# Patient Record
Sex: Male | Born: 1975 | Race: Black or African American | Hispanic: No | Marital: Married | Smoking: Never smoker
Health system: Southern US, Community
[De-identification: ages and names within clinical notes are randomized; demographics above are authoritative.]

---

## 2021-11-03 ENCOUNTER — Other Ambulatory Visit: Payer: Self-pay

## 2021-11-03 ENCOUNTER — Encounter: Payer: Self-pay | Admitting: *Deleted

## 2021-11-03 ENCOUNTER — Emergency Department
Admission: EM | Admit: 2021-11-03 | Discharge: 2021-11-03 | Disposition: A | Discharge status: Home / Self Care | Payer: BC Managed Care – PPO | Attending: Emergency Medicine | Admitting: Emergency Medicine

## 2021-11-03 ENCOUNTER — Emergency Department: Payer: BC Managed Care – PPO

## 2021-11-03 DIAGNOSIS — Z20822 Contact with and (suspected) exposure to covid-19: Secondary | ICD-10-CM | POA: Insufficient documentation

## 2021-11-03 DIAGNOSIS — J069 Acute upper respiratory infection, unspecified: Secondary | ICD-10-CM | POA: Diagnosis not present

## 2021-11-03 DIAGNOSIS — R059 Cough, unspecified: Secondary | ICD-10-CM | POA: Diagnosis present

## 2021-11-03 LAB — RESP PANEL BY RT-PCR (FLU A&B, COVID) ARPGX2
Influenza A by PCR: NEGATIVE
Influenza B by PCR: NEGATIVE
SARS Coronavirus 2 by RT PCR: NEGATIVE

## 2021-11-03 NOTE — Discharge Instructions (Signed)
You can take one spray of Flonase each side for seven days.

## 2021-11-03 NOTE — ED Triage Notes (Signed)
Pt to ED reporting cough and congestion with generalized aching for the past couple days. Abd pain on Saturday after working in some dust but that has subsided.

## 2021-11-03 NOTE — ED Notes (Signed)
See triage note. Pt states that after dust exposure, sinus congestion, cough, fever of 101 on Sunday.

## 2021-11-03 NOTE — ED Provider Notes (Signed)
[facility]-EMERGENCY DEPARTMENT  ____________________________________________  Time seen: Approximately 11:51 PM  I have reviewed the triage vital signs and the nursing notes.   HISTORY  Chief Complaint Nasal Congestion   Historian Patient    HPI Guy Smith is a 45 y.o. male presents to the emergency department with fever, nasal congestion, nonproductive cough and malaise for the past 2 to 3 days.  No chest pain, chest tightness or abdominal pain.  Patient has had fever for the past 2 days.   History reviewed. No pertinent past medical history.   Immunizations up to date:  Yes.     History reviewed. No pertinent past medical history.  There are no problems to display for this patient.   History reviewed. No pertinent surgical history.  Prior to Admission medications   Not on File    Allergies Patient has no known allergies.  History reviewed. No pertinent family history.  Social History Social History   Tobacco Use   Smoking status: Never   Smokeless tobacco: Never      Review of Systems  Constitutional: Patient has fever.  Eyes: No visual changes. No discharge ENT: Patient has congestion.  Cardiovascular: no chest pain. Respiratory: Patient has cough.  Gastrointestinal: No abdominal pain.  No nausea, no vomiting. Patient had diarrhea.  Genitourinary: Negative for dysuria. No hematuria Musculoskeletal: Patient has myalgias.  Skin: Negative for rash, abrasions, lacerations, ecchymosis. Neurological: Patient has headache, no focal weakness or numbness.    ____________________________________________   PHYSICAL EXAM:  VITAL SIGNS: ED Triage Vitals [11/03/21 2118]  Enc Vitals Group     BP (!) 160/95     Pulse Rate 74     Resp 16     Temp 98.3 F (36.8 C)     Temp Source Oral     SpO2 98 %     Weight      Height      Head Circumference      Peak Flow      Pain Score 0     Pain Loc      Pain Edu?      Excl. in [place]?       Constitutional: Alert and oriented. Patient is lying supine. Eyes: Conjunctivae are normal. PERRL. EOMI. Head: Atraumatic. ENT:      Ears: Tympanic membranes are mildly injected with mild effusion bilaterally.       Nose: No congestion/rhinnorhea.      Mouth/Throat: Mucous membranes are moist. Posterior pharynx is mildly erythematous.  Hematological/Lymphatic/Immunilogical: No cervical lymphadenopathy.  Cardiovascular: Normal rate, regular rhythm. Normal S1 and S2.  Good peripheral circulation. Respiratory: Normal respiratory effort without tachypnea or retractions. Lungs CTAB. Good air entry to the bases with no decreased or absent breath sounds. Gastrointestinal: Bowel sounds 4 quadrants. Soft and nontender to palpation. No guarding or rigidity. No palpable masses. No distention. No CVA tenderness. Musculoskeletal: Full range of motion to all extremities. No gross deformities appreciated. Neurologic:  Normal speech and language. No gross focal neurologic deficits are appreciated.  Skin:  Skin is warm, dry and intact. No rash noted. Psychiatric: Mood and affect are normal. Speech and behavior are normal. Patient exhibits appropriate insight and judgement.   ____________________________________________   LABS (all labs ordered are listed, but only abnormal results are displayed)  Labs Reviewed  RESP PANEL BY RT-PCR (FLU A&B, COVID) [id number]   ____________________________________________  EKG   ____________________________________________  RADIOLOGY Unk Pinto, personally viewed and evaluated these images (plain radiographs) as part of  my medical decision making, as well as reviewing the written report by the radiologist.  DG Chest 2 View  Result Date: 11/03/2021 CLINICAL DATA:  45 year old male with cough. EXAM: CHEST - 2 VIEW COMPARISON:  None. FINDINGS: The cardiomediastinal silhouette is unremarkable. There is no evidence of focal airspace disease, pulmonary  edema, suspicious pulmonary nodule/mass, pleural effusion, or pneumothorax. No acute bony abnormalities are identified. IMPRESSION: No active cardiopulmonary disease. Electronically Signed   By: Harmon Pier M.D.   On: 11/03/2021 21:50    ____________________________________________    PROCEDURES  Procedure(s) performed:     Procedures     Medications - No data to display   ____________________________________________   INITIAL IMPRESSION / ASSESSMENT AND PLAN / ED COURSE  Pertinent labs & imaging results that were available during my care of the patient were reviewed by me and considered in my medical decision making (see chart for details).    Assessment and Plan:   Viral URI 45 year old male presents to the emergency department with rhinorrhea, nasal congestion and nonproductive cough for the past 2 to 3 days.  Patient tested negative for influenza and COVID-19.  No consolidations, opacities or infiltrates on chest x-ray.  Suspect unspecified viral URI at this time.  Rest and hydration were encouraged at home.  Tylenol and ibuprofen alternating for fever were recommended.     ____________________________________________  FINAL CLINICAL IMPRESSION(S) / ED DIAGNOSES  Final diagnoses:  Viral upper respiratory tract infection      NEW MEDICATIONS STARTED DURING THIS VISIT:  ED Discharge Orders     None           This chart was dictated using voice recognition software/Dragon. Despite best efforts to proofread, errors can occur which can change the meaning. Any change was purely unintentional.     Orvil Feil, PA-C 11/03/21 Arletha Grippe    Shaune Pollack, MD 11/04/21 [phone]

## 2022-11-16 IMAGING — CR DG CHEST 2V
2 series · 2 of 2 positions shown · non-contrast
Comparison: None.

CLINICAL DATA: 45-year-old male with cough.

EXAM:
CHEST - 2 VIEW

[chest pa]
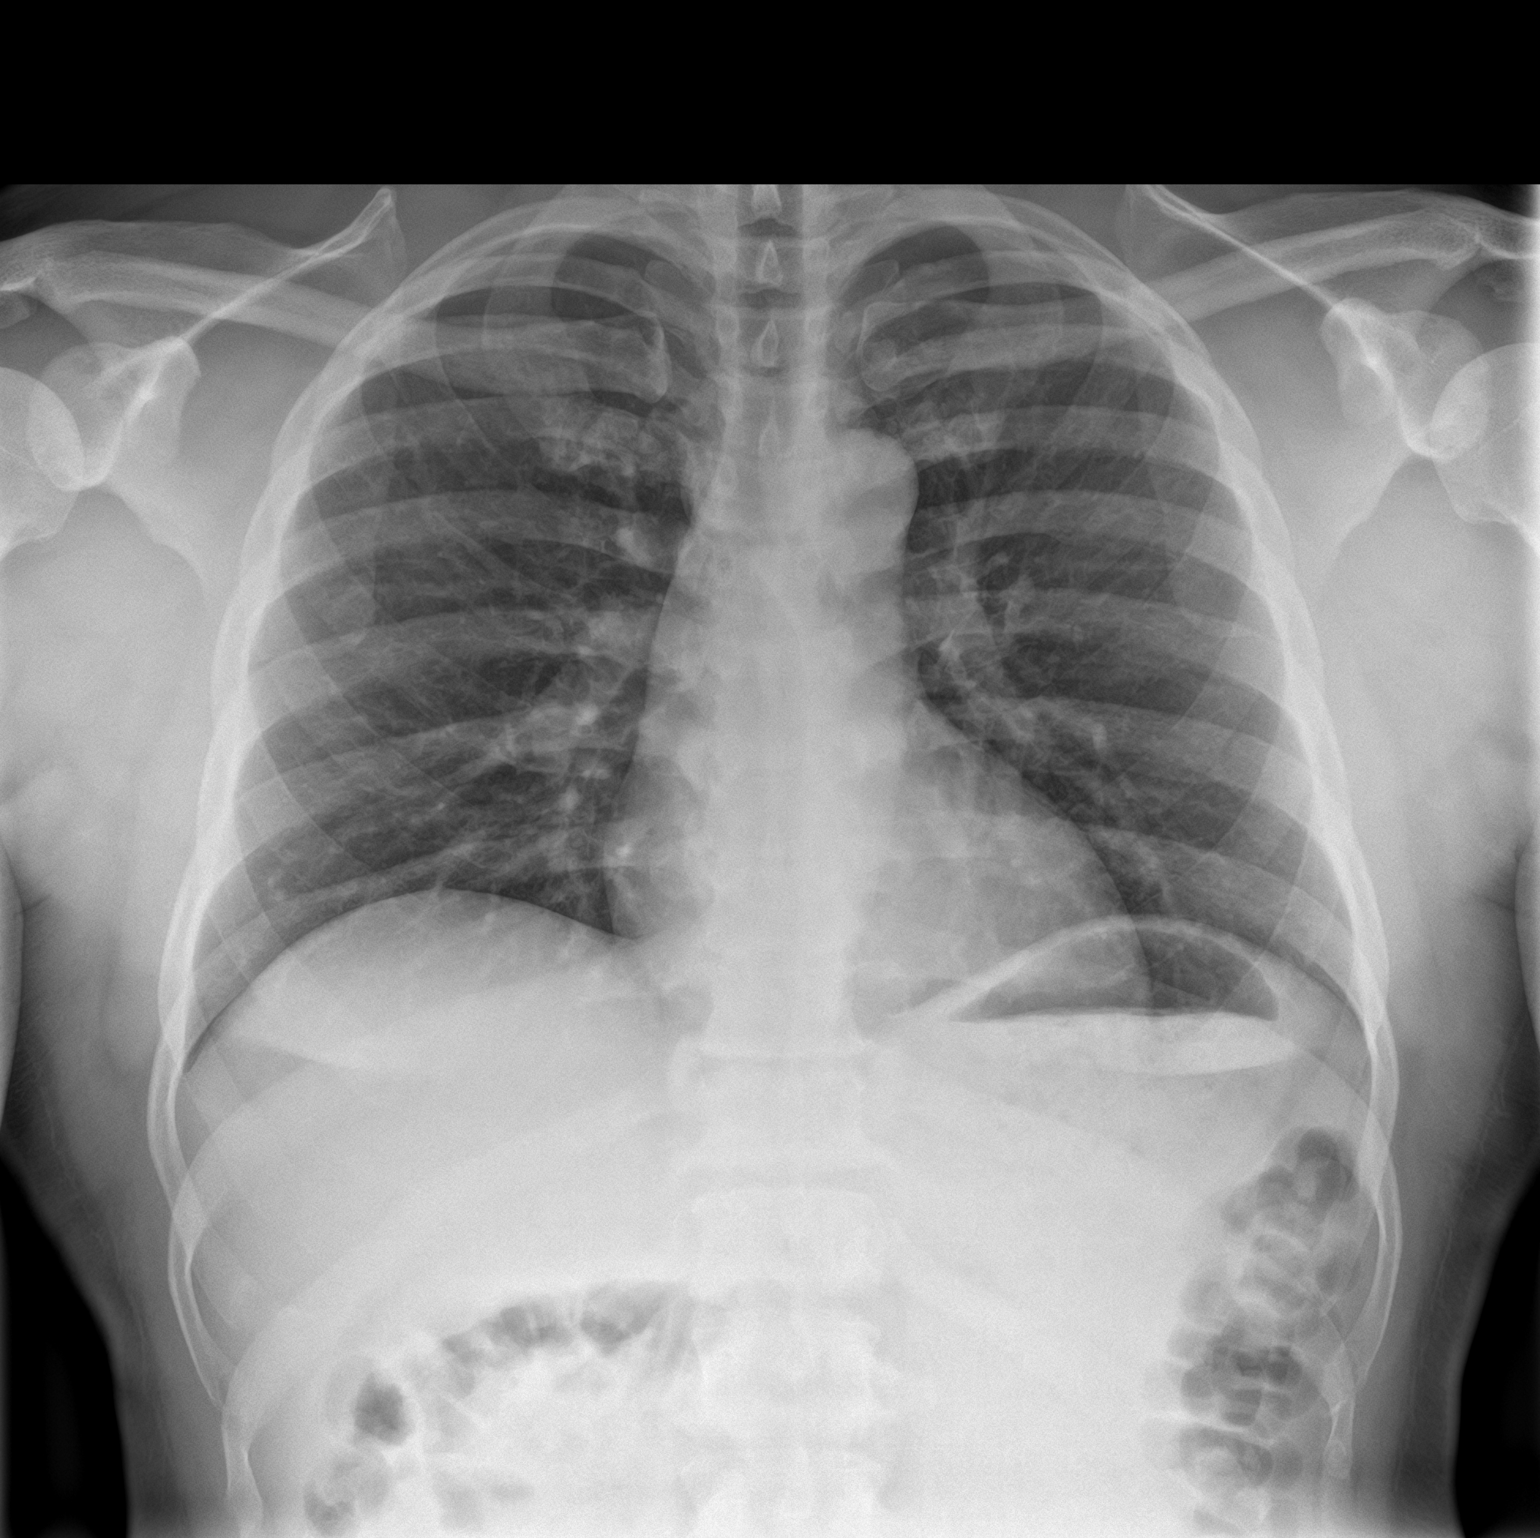

[chest lat]
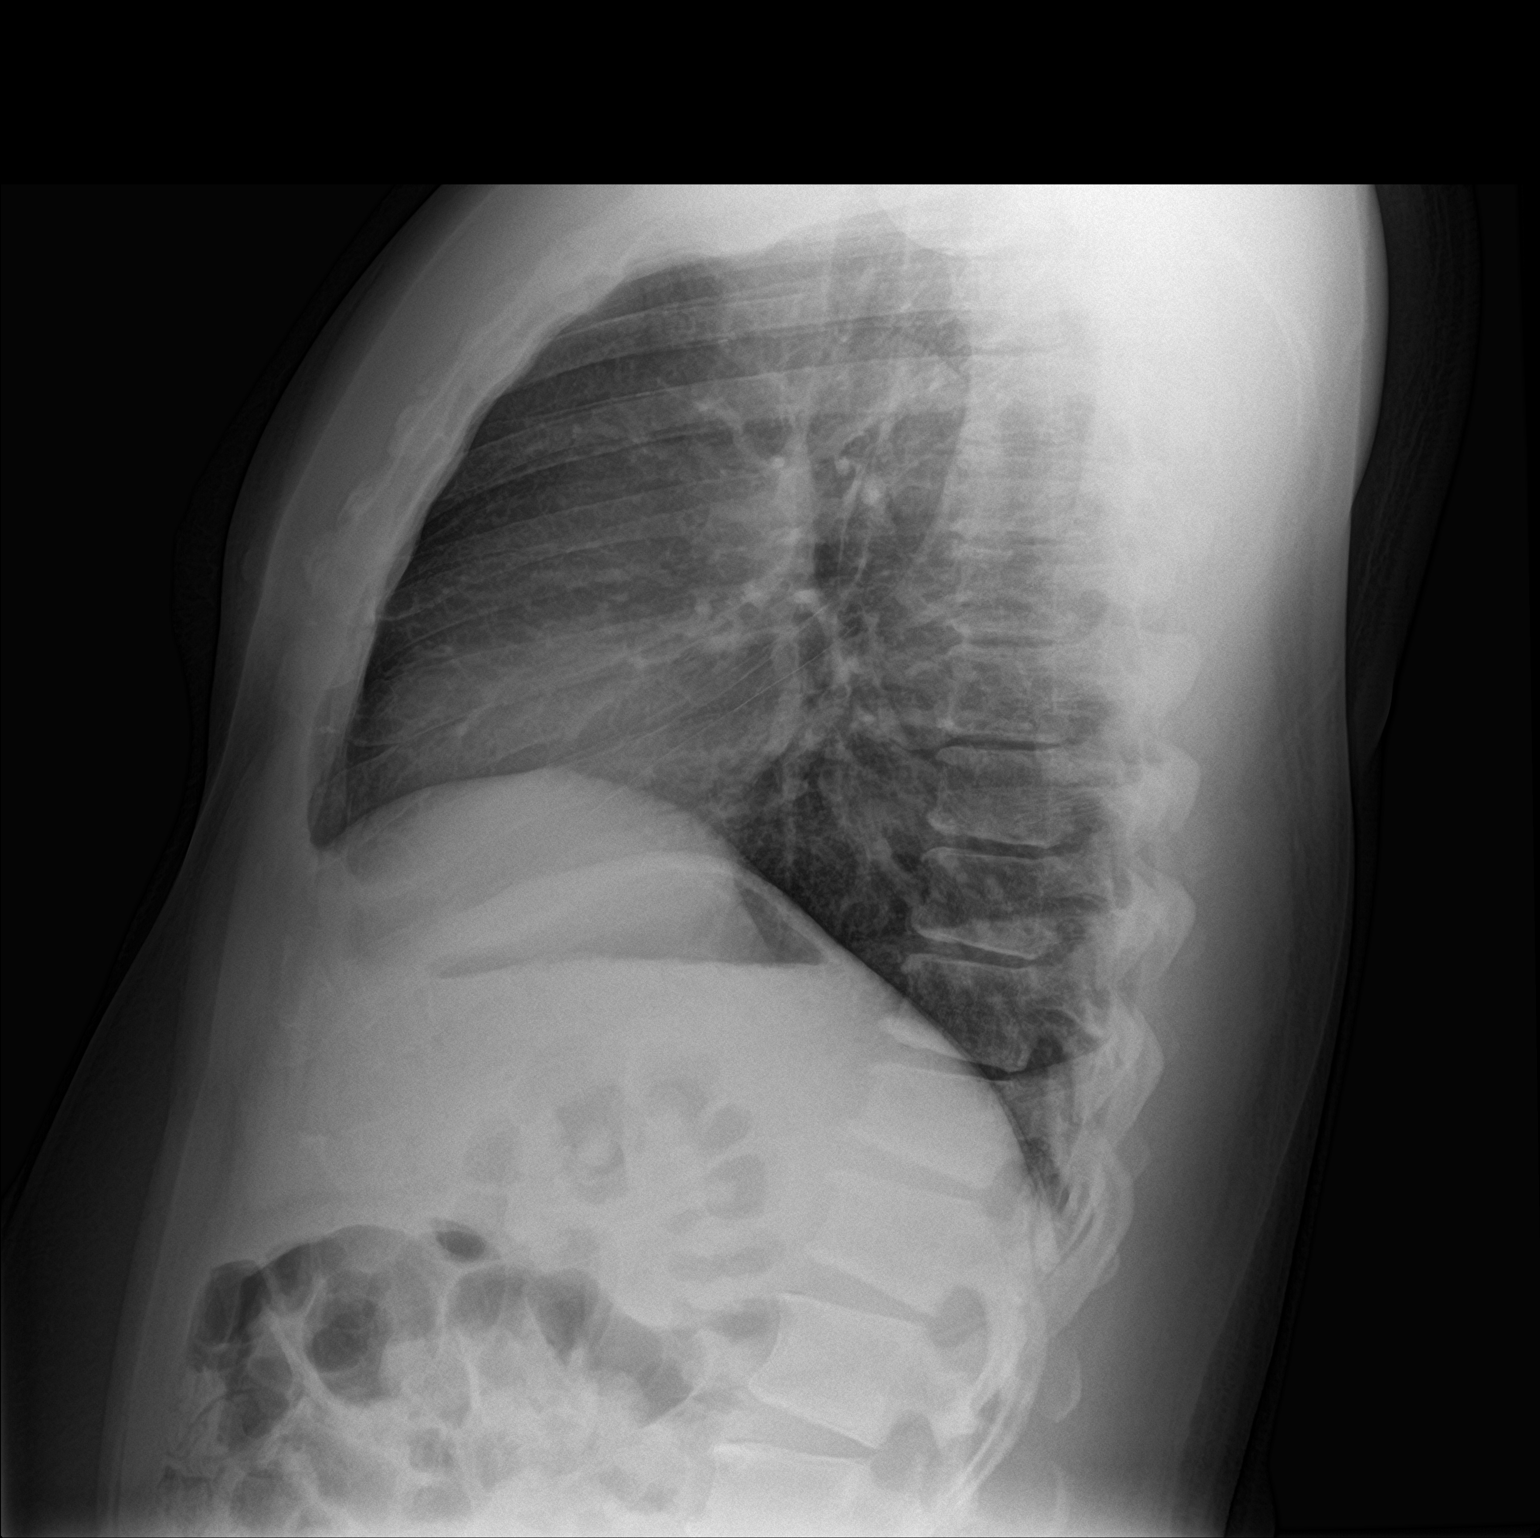

[2 of 2 positions shown; findings below may reference images not displayed]

FINDINGS: The cardiomediastinal silhouette is unremarkable.

There is no evidence of focal airspace disease, pulmonary edema,
suspicious pulmonary nodule/mass, pleural effusion, or pneumothorax.

No acute bony abnormalities are identified.
IMPRESSION: No active cardiopulmonary disease.
